# Patient Record
Sex: Female | Born: 1955 | Race: Black or African American | Hispanic: No | Marital: Married | Smoking: Former smoker
Health system: Southern US, Community
[De-identification: ages and names within clinical notes are randomized; demographics above are authoritative.]

## PROBLEM LIST (undated history)

## (undated) ENCOUNTER — Ambulatory Visit: Payer: BC Managed Care – PPO | Source: Home / Self Care

## (undated) DIAGNOSIS — R112 Nausea with vomiting, unspecified: Secondary | ICD-10-CM

## (undated) DIAGNOSIS — Z9109 Other allergy status, other than to drugs and biological substances: Secondary | ICD-10-CM

## (undated) DIAGNOSIS — N951 Menopausal and female climacteric states: Secondary | ICD-10-CM

## (undated) DIAGNOSIS — Z9889 Other specified postprocedural states: Secondary | ICD-10-CM

## (undated) HISTORY — DX: Nausea with vomiting, unspecified: R11.2

## (undated) HISTORY — DX: Other allergy status, other than to drugs and biological substances: Z91.09

## (undated) HISTORY — DX: Other specified postprocedural states: Z98.890

## (undated) HISTORY — PX: DILATION AND CURETTAGE OF UTERUS: SHX78

## (undated) HISTORY — PX: TONSILLECTOMY: SUR1361

## (undated) HISTORY — DX: Menopausal and female climacteric states: N95.1

## (undated) HISTORY — PX: APPENDECTOMY: SHX54

---

## 1998-01-21 ENCOUNTER — Other Ambulatory Visit: Admission: RE | Admit: 1998-01-21 | Discharge: 1998-01-21 | Payer: Self-pay | Admitting: *Deleted

## 2000-04-12 ENCOUNTER — Other Ambulatory Visit: Admission: RE | Admit: 2000-04-12 | Discharge: 2000-04-12 | Payer: Self-pay | Admitting: Obstetrics and Gynecology

## 2002-08-31 ENCOUNTER — Other Ambulatory Visit: Admission: RE | Admit: 2002-08-31 | Discharge: 2002-08-31 | Payer: Self-pay | Admitting: Obstetrics and Gynecology

## 2003-09-05 ENCOUNTER — Other Ambulatory Visit: Admission: RE | Admit: 2003-09-05 | Discharge: 2003-09-05 | Payer: Self-pay | Admitting: Obstetrics and Gynecology

## 2005-07-07 ENCOUNTER — Other Ambulatory Visit: Admission: RE | Admit: 2005-07-07 | Discharge: 2005-07-07 | Payer: Self-pay | Admitting: Obstetrics and Gynecology

## 2006-11-09 ENCOUNTER — Encounter: Admission: RE | Admit: 2006-11-09 | Discharge: 2006-11-09 | Payer: Self-pay | Admitting: Obstetrics and Gynecology

## 2007-11-01 ENCOUNTER — Emergency Department (HOSPITAL_COMMUNITY): Admission: EM | Admit: 2007-11-01 | Discharge: 2007-11-01 | Payer: Self-pay | Admitting: Emergency Medicine

## 2008-04-04 IMAGING — MG MM DIAGNOSTIC UNILATERAL R
4 series · 4 of 4 positions shown · non-contrast
Comparison: 07/07/05, 08/31/02 and 09/05/03.

DG DIAGNOSTIC UNILATERAL R
CC and MLO view(s) were taken of the right breast.

DIGITAL UNILATERAL RIGHT DIAGNOSTIC MAMMOGRAM:
CLINICAL DATA: 50-year-old returns for evaluation of the right breast following screening study at

[R CC (1 of 2)]
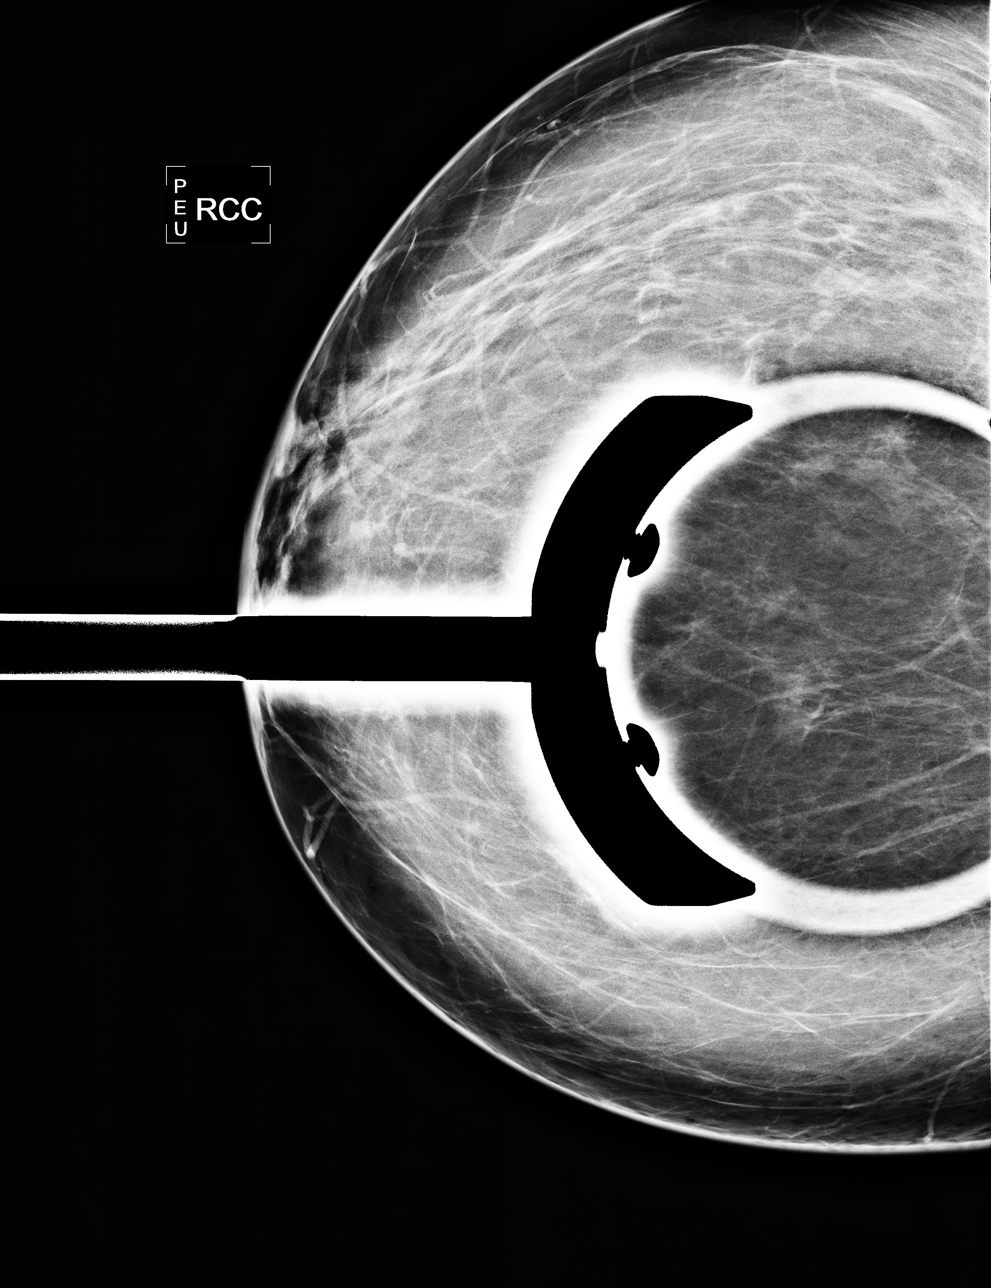

[R MLO]
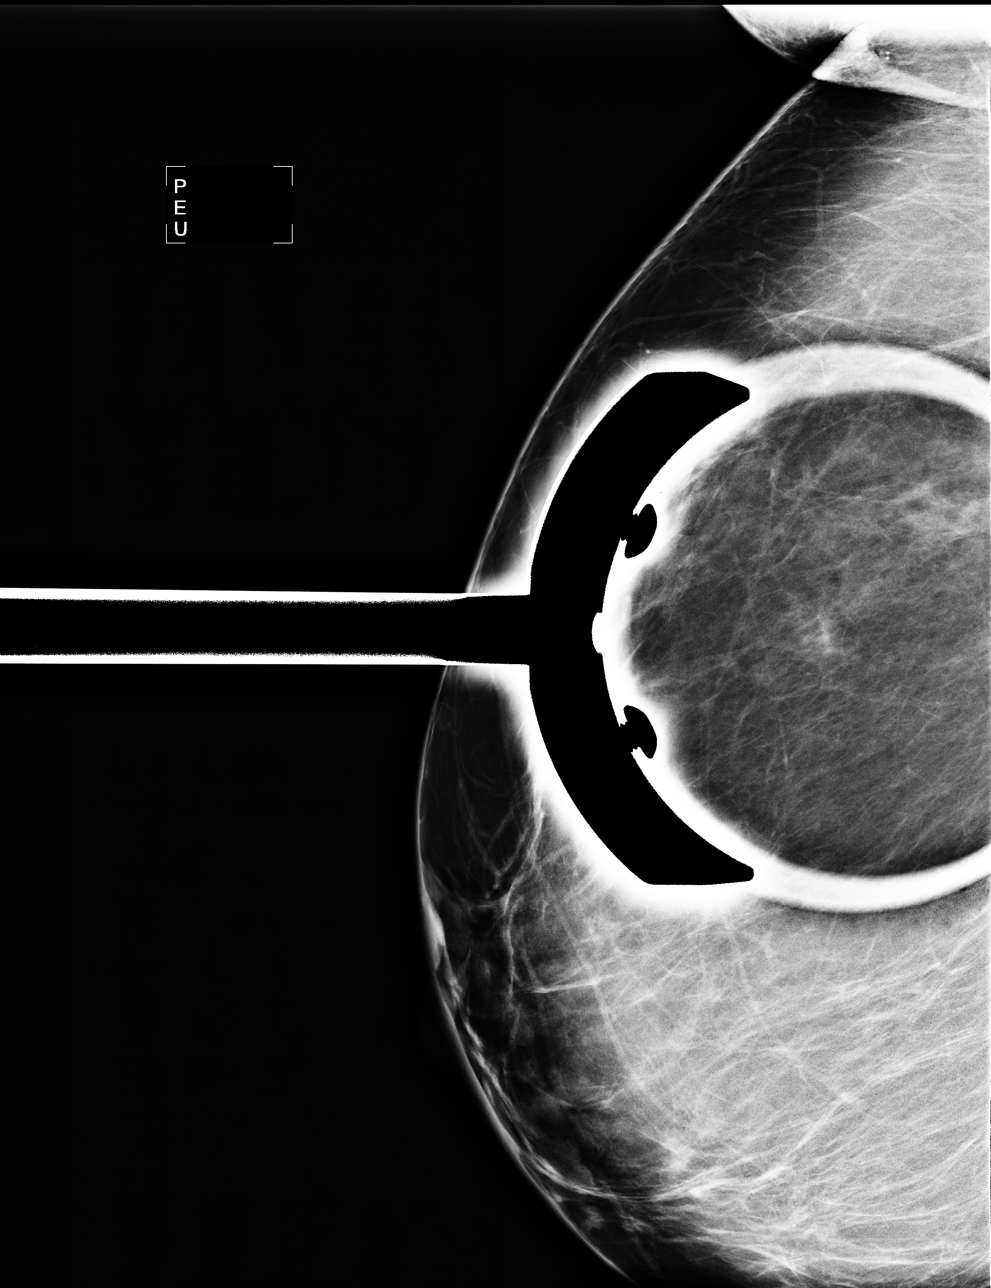

[R ML]
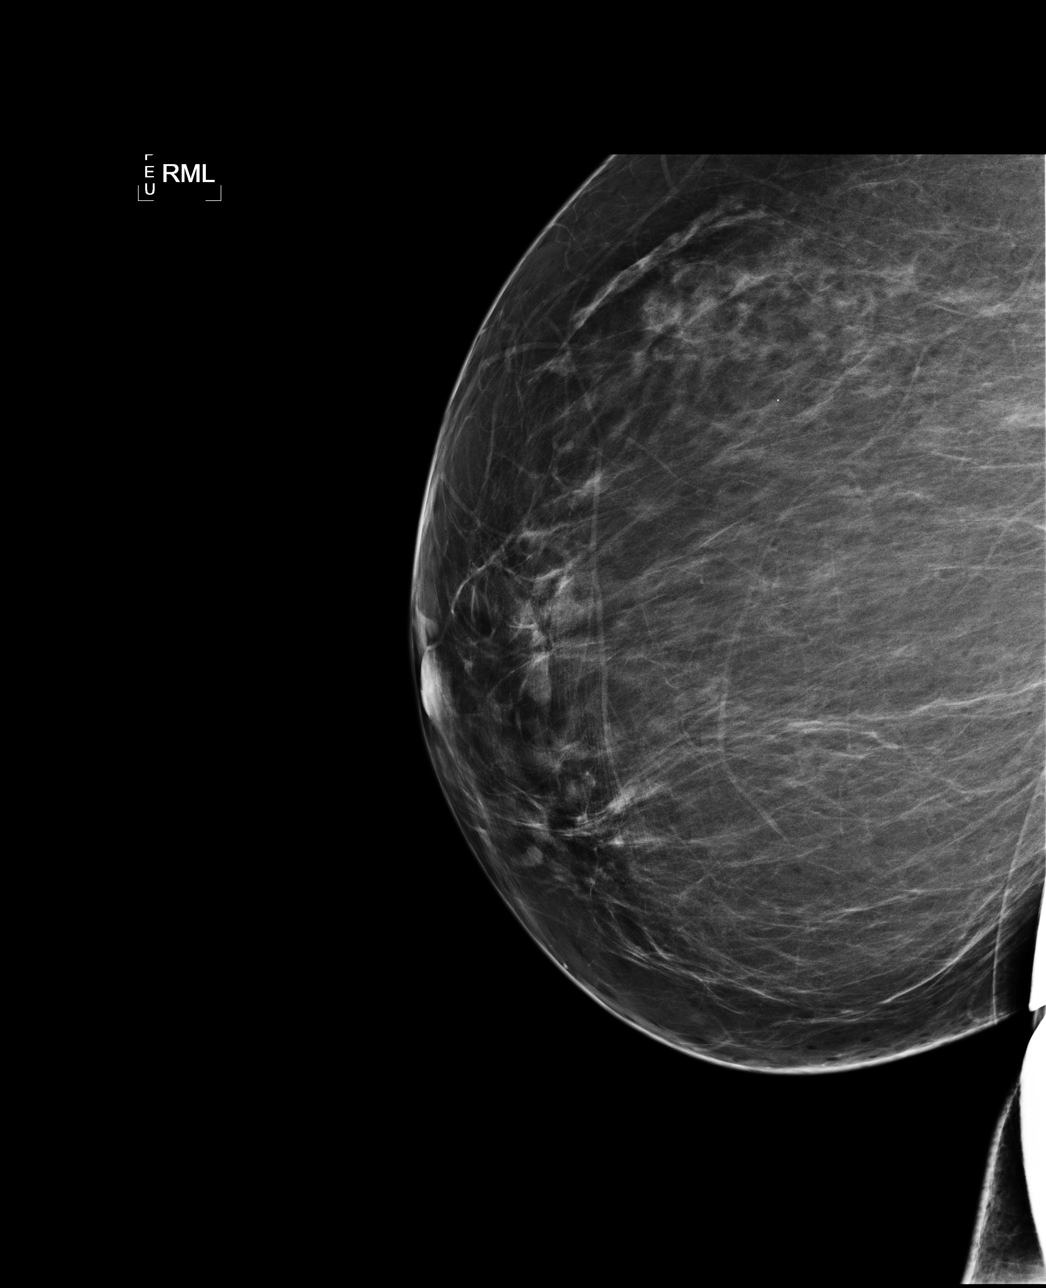

[R CC (2 of 2)]
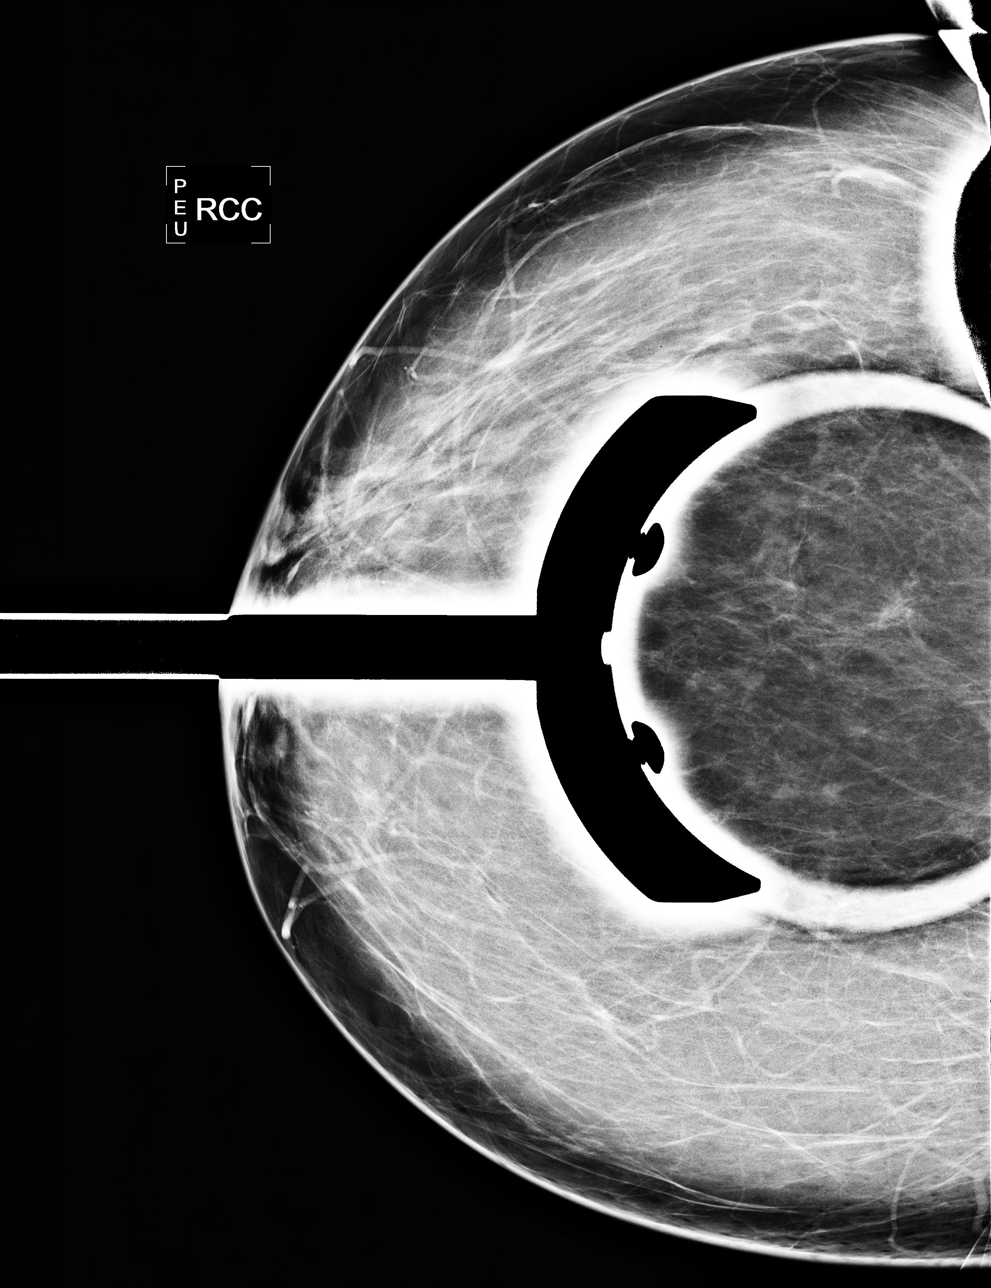

[4 of 4 positions shown; findings below may reference images not displayed]

Spot compression views are performed of the right breast, showing patchy fibroglandular parenchyma 
in the upper portion of the right breast. No discrete mass or distortion is identified.
IMPRESSION: No persistent area of concern on further evaluation of the right breast.

ASSESSMENT: Negative - BI-RADS 1

Screening mammogram of both breasts in 1 year.
, THIS PROCEDURE WAS A DIGITAL MAMMOGRAM.

## 2009-03-20 ENCOUNTER — Encounter: Payer: Self-pay | Admitting: Cardiology

## 2009-03-27 IMAGING — CR DG CHEST 2V
2 series · 2 of 2 positions shown · non-contrast
Comparison: None

CLINICAL DATA: Dyspnea. Chest pain.

CHEST - 2 VIEW

[w chest pa]
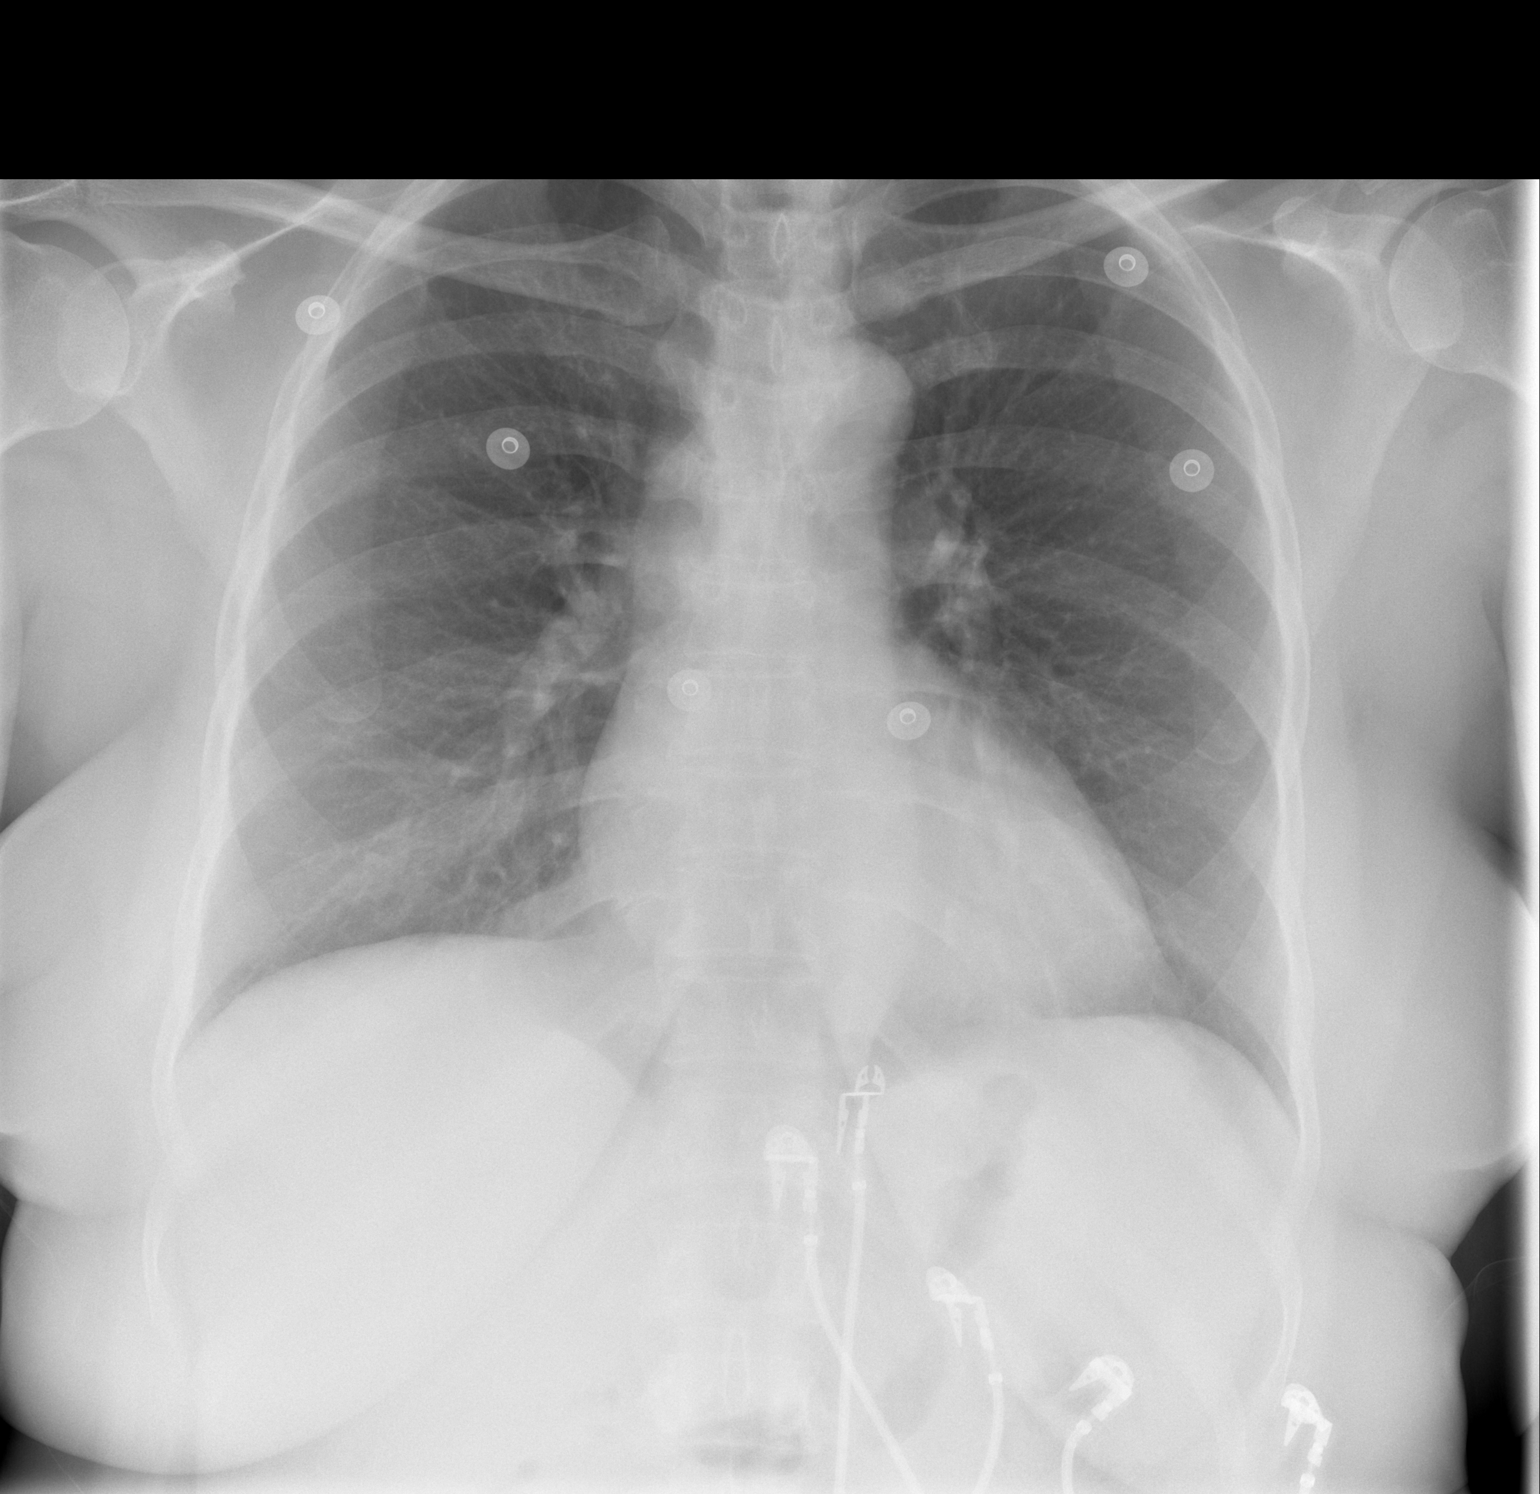

[w chest lat]
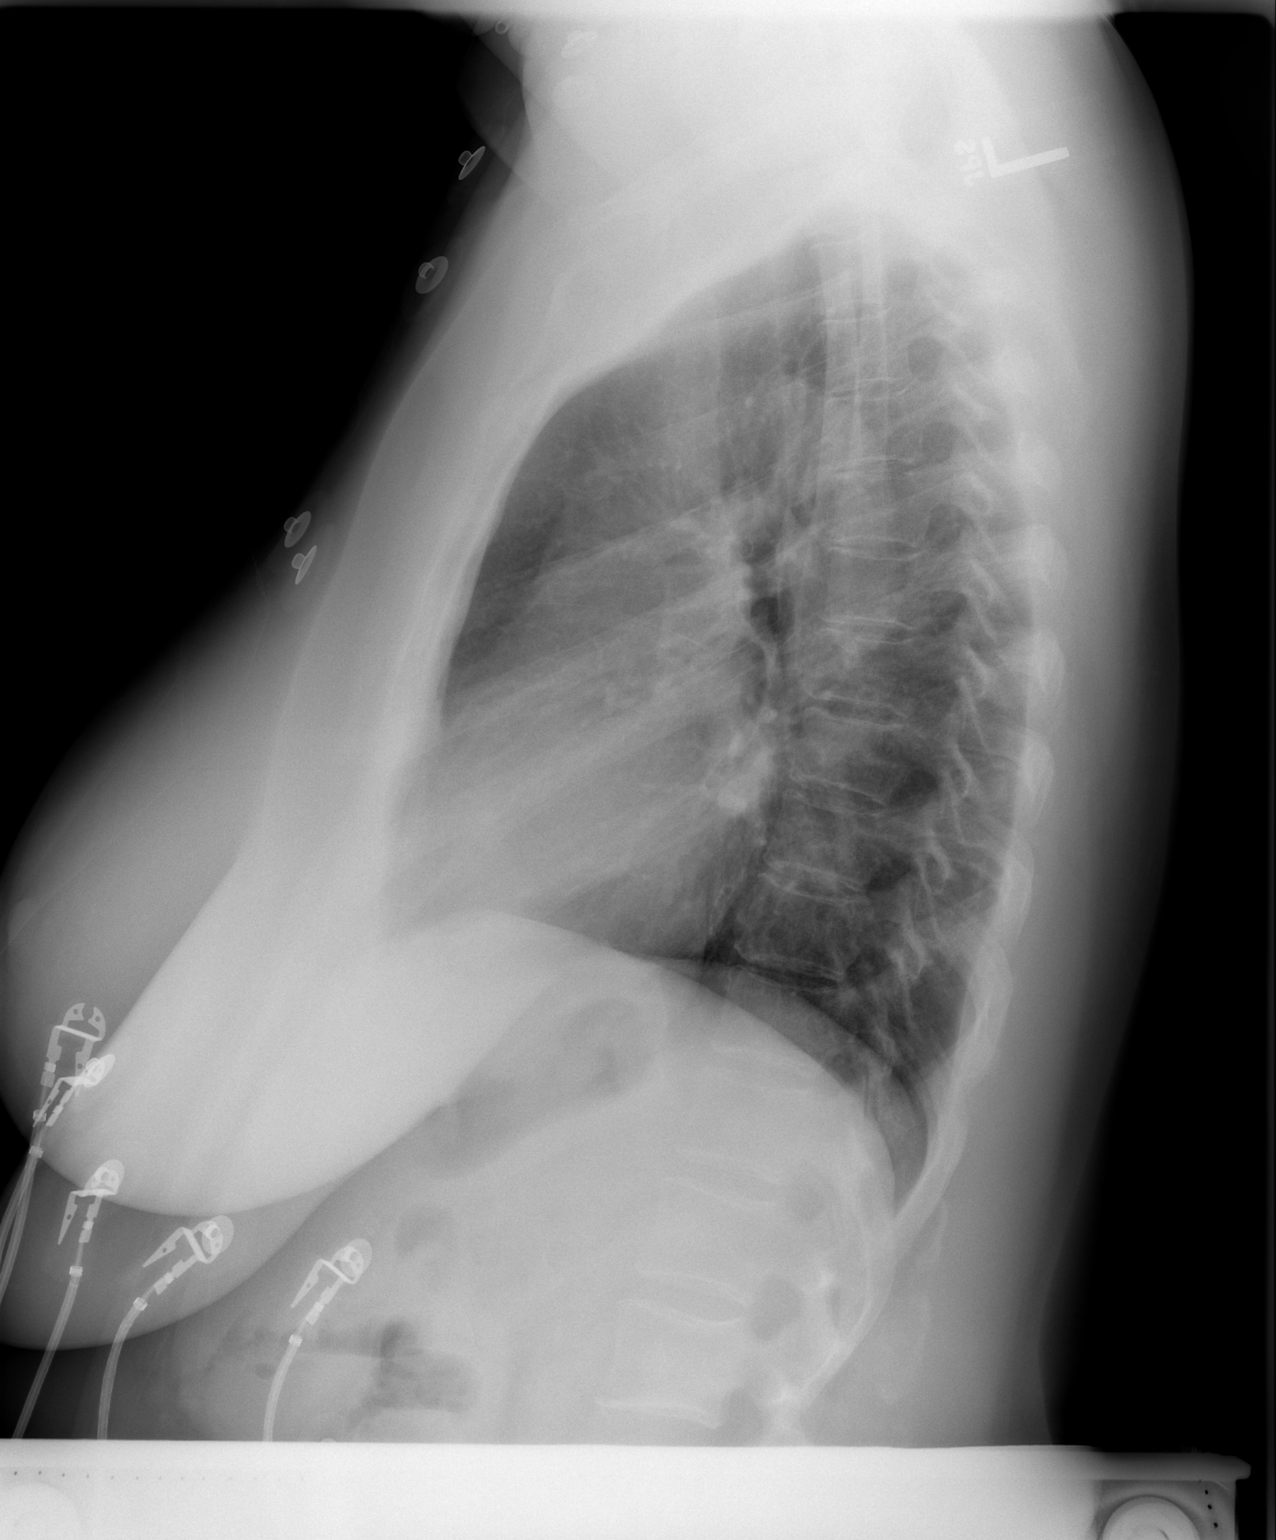

[2 of 2 positions shown; findings below may reference images not displayed]

FINDINGS: Cardiac and mediastinal contours appear normal. No adenopathy
identified. The lungs appear clear. No pleural effusion or acute thoracic
findings.

IMPRESSION

1. No acute thoracic findings.

## 2009-04-30 DIAGNOSIS — R079 Chest pain, unspecified: Secondary | ICD-10-CM

## 2009-05-08 ENCOUNTER — Ambulatory Visit: Payer: Self-pay | Admitting: Cardiology

## 2009-05-08 DIAGNOSIS — E783 Hyperchylomicronemia: Secondary | ICD-10-CM | POA: Insufficient documentation

## 2010-01-24 ENCOUNTER — Ambulatory Visit (HOSPITAL_COMMUNITY): Admission: RE | Admit: 2010-01-24 | Discharge: 2010-01-24 | Payer: Self-pay | Admitting: Obstetrics and Gynecology

## 2010-11-09 ENCOUNTER — Encounter: Payer: Self-pay | Admitting: Obstetrics and Gynecology

## 2011-01-07 LAB — CBC
HCT: 40.5 % (ref 36.0–46.0)
MCV: 87.7 fL (ref 78.0–100.0)
RBC: 4.62 MIL/uL (ref 3.87–5.11)
WBC: 7.4 10*3/uL (ref 4.0–10.5)

## 2011-07-09 LAB — CBC
Hemoglobin: 12.7
MCHC: 33.4
Platelets: 373
RDW: 14.8

## 2011-07-09 LAB — DIFFERENTIAL
Basophils Absolute: 0
Basophils Relative: 0
Eosinophils Absolute: 0.1
Eosinophils Relative: 1
Monocytes Absolute: 0.5
Monocytes Relative: 5
Neutro Abs: 5.8

## 2011-07-09 LAB — POCT CARDIAC MARKERS
CKMB, poc: 1.1
CKMB, poc: 1.3
Myoglobin, poc: 78.8
Operator id: 284251
Operator id: 284251
Troponin i, poc: <0.05
Troponin i, poc: <0.05

## 2011-07-09 LAB — URINALYSIS, ROUTINE W REFLEX MICROSCOPIC: Specific Gravity, Urine: 1.026

## 2011-07-09 LAB — BASIC METABOLIC PANEL: Potassium: 3.9

## 2012-11-18 ENCOUNTER — Other Ambulatory Visit: Payer: Self-pay | Admitting: Obstetrics and Gynecology

## 2013-11-16 ENCOUNTER — Encounter: Payer: Self-pay | Admitting: Gastroenterology

## 2013-11-16 ENCOUNTER — Other Ambulatory Visit: Payer: Self-pay | Admitting: Obstetrics and Gynecology

## 2013-12-12 ENCOUNTER — Ambulatory Visit (AMBULATORY_SURGERY_CENTER): Payer: Self-pay

## 2013-12-12 VITALS — Ht 63.0 in | Wt 190.0 lb

## 2013-12-12 DIAGNOSIS — Z1211 Encounter for screening for malignant neoplasm of colon: Secondary | ICD-10-CM

## 2013-12-12 MED ORDER — MOVIPREP 100 G PO SOLR: 1.0000 | Freq: Once | ORAL | Status: AC

## 2013-12-14 ENCOUNTER — Encounter: Payer: Self-pay | Admitting: Gastroenterology

## 2013-12-26 ENCOUNTER — Encounter: Payer: Self-pay | Admitting: Gastroenterology

## 2015-01-25 ENCOUNTER — Other Ambulatory Visit: Payer: Self-pay | Admitting: Obstetrics and Gynecology

## 2015-01-28 LAB — CYTOLOGY - PAP

## 2020-05-28 ENCOUNTER — Ambulatory Visit: Payer: Self-pay

## 2020-05-28 ENCOUNTER — Ambulatory Visit: Payer: Self-pay | Attending: Internal Medicine

## 2020-05-28 DIAGNOSIS — Z23 Encounter for immunization: Secondary | ICD-10-CM

## 2020-05-28 NOTE — Progress Notes (Signed)
   Covid-19 Vaccination Clinic  Name:  MALICIA BLASDEL    MRN: [id number] DOB: 1955-12-18  05/28/2020  Ms. Kuhnle was observed post Covid-19 immunization for 30 minutes based on pre-vaccination screening without incident. She was provided with Vaccine Information Sheet and instruction to access the V-Safe system.   Ms. Piscitelli was instructed to call 911 with any severe reactions post vaccine: Marland Kitchen Difficulty breathing  . Swelling of face and throat  . A fast heartbeat  . A bad rash all over body  . Dizziness and weakness   Immunizations Administered    Name Date Dose VIS Date Route   Pfizer COVID-19 Vaccine 05/28/2020  3:20 PM 0.3 mL 12/13/2018 Intramuscular   Manufacturer: [employer], [employer]   Lot: [id number]   NDC: [address]-5

## 2020-06-18 ENCOUNTER — Ambulatory Visit: Payer: BC Managed Care – PPO | Attending: Critical Care Medicine

## 2020-06-18 DIAGNOSIS — Z23 Encounter for immunization: Secondary | ICD-10-CM

## 2020-06-18 NOTE — Progress Notes (Signed)
   Covid-19 Vaccination Clinic  Name:  LIBNI FUSARO    MRN: [id number] DOB: 03/01/1956  06/18/2020  Ms. Barbary was observed post Covid-19 immunization for 30 minutes based on pre-vaccination screening without incident. She was provided with Vaccine Information Sheet and instruction to access the V-Safe system.   Ms. Boak was instructed to call 911 with any severe reactions post vaccine: Marland Kitchen Difficulty breathing  . Swelling of face and throat  . A fast heartbeat  . A bad rash all over body  . Dizziness and weakness   Immunizations Administered    Name Date Dose VIS Date Route   Pfizer COVID-19 Vaccine 06/18/2020  3:07 PM 0.3 mL 12/13/2018 Intramuscular   Manufacturer: [employer], [employer]   Lot: [id number]   NDC: [address]-6

## 2022-04-08 ENCOUNTER — Other Ambulatory Visit: Payer: Self-pay | Admitting: Obstetrics and Gynecology

## 2022-04-08 DIAGNOSIS — R928 Other abnormal and inconclusive findings on diagnostic imaging of breast: Secondary | ICD-10-CM

## 2022-04-17 ENCOUNTER — Ambulatory Visit
Admission: RE | Admit: 2022-04-17 | Discharge: 2022-04-17 | Disposition: A | Discharge status: Home / Self Care | Payer: BC Managed Care – PPO | Source: Ambulatory Visit | Attending: Obstetrics and Gynecology | Admitting: Obstetrics and Gynecology

## 2022-04-17 DIAGNOSIS — R928 Other abnormal and inconclusive findings on diagnostic imaging of breast: Secondary | ICD-10-CM

## 2022-09-28 ENCOUNTER — Ambulatory Visit
Admission: EM | Admit: 2022-09-28 | Discharge: 2022-09-28 | Disposition: A | Discharge status: Home / Self Care | Payer: BC Managed Care – PPO

## 2022-09-28 DIAGNOSIS — J309 Allergic rhinitis, unspecified: Secondary | ICD-10-CM | POA: Diagnosis not present

## 2022-09-28 DIAGNOSIS — J208 Acute bronchitis due to other specified organisms: Secondary | ICD-10-CM

## 2022-09-28 MED ORDER — ALBUTEROL SULFATE HFA 108 (90 BASE) MCG/ACT IN AERS: 2.0000 | INHALATION_SPRAY | Freq: Four times a day (QID) | RESPIRATORY_TRACT | 0 refills | Status: AC | PRN

## 2022-09-28 MED ORDER — METHYLPREDNISOLONE 8 MG PO TABS: 8.0000 mg | ORAL_TABLET | Freq: Every day | ORAL | 0 refills | Status: DC

## 2022-09-28 MED ORDER — FLUTICASONE PROPIONATE 50 MCG/ACT NA SUSP: 1.0000 | Freq: Every day | NASAL | 2 refills | Status: AC

## 2022-09-28 MED ORDER — METHYLPREDNISOLONE 8 MG PO TABS: 16.0000 mg | ORAL_TABLET | Freq: Every day | ORAL | 0 refills | Status: AC

## 2022-09-28 MED ORDER — IBUPROFEN 400 MG PO TABS: 400.0000 mg | ORAL_TABLET | Freq: Three times a day (TID) | ORAL | 0 refills | Status: AC | PRN

## 2022-09-28 MED ORDER — LORATADINE 10 MG PO TABS: 10.0000 mg | ORAL_TABLET | Freq: Every day | ORAL | 1 refills | Status: AC

## 2022-09-28 MED ORDER — IPRATROPIUM-ALBUTEROL 0.5-2.5 (3) MG/3ML IN SOLN
3.0000 mL | Freq: Once | RESPIRATORY_TRACT | Status: AC
  Administered 2022-09-28: 3 mL via RESPIRATORY_TRACT

## 2022-09-28 NOTE — Discharge Instructions (Signed)
At this time, I believe that you are suffering from a viral bronchitis as well as uncontrolled respiratory allergies.  I recommend that you discontinue Claritin-D as it contains an extra ingredient, Sudafed, it may actually be making your symptoms worse at this time.  Please read below to learn more about the medications, dosages and frequencies that I recommend to help alleviate your symptoms and to get you feeling better soon:  Medrol (methylprednisolone): This is a steroid that will significantly calm your upper and lower airways, please take the daily recommended quantity of tablets daily with your breakfast meal starting tomorrow morning until the prescription is complete.  Please do not take ibuprofen or naproxen (Advil, Motrin, Aleve) while taking Medrol.   Claritin (loratadine): This is an excellent second-generation antihistamine that does not contain pseudoephedrine.  Claritin helps to reduce respiratory inflammatory response to environmental allergens.  This medication is not known to cause daytime sleepiness so it can be taken in the daytime.  If you find that it does make you sleepy, please feel free to take it at bedtime.   Flonase (fluticasone): This is a steroid nasal spray that you use once daily, 1 spray in each nare.  This medication does not work well if you decide to use it only used as you feel you need to, it works best used on a daily basis.  After 3 to 5 days of use, you will notice significant reduction of the inflammation and mucus production that is currently being caused by exposure to allergens, whether seasonal or environmental.  The most common side effect of this medication is nosebleeds.  If you experience a nosebleed, please discontinue use for 1 week, then feel free to resume.  I have provided you with a prescription.     ProAir, Ventolin, Proventil (albuterol): This inhaled medication contains a short acting beta agonist bronchodilator.  This medication works on the  smooth muscle that opens and constricts of your airways by relaxing the muscle.  The result of relaxation of the smooth muscle is increased air movement and improved work of breathing.  This is a short acting medication that can be used every 4-6 hours as needed for increased work of breathing, shortness of breath, wheezing and excessive coughing.  I have provided you with a prescription.  Please inhale 2 puffs 4 times daily for the next few days, then decrease to 2 puffs twice daily and as needed until you are feeling completely better.   Advil, Motrin (ibuprofen): This is a good anti-inflammatory medication which not only addresses aches, pains but also significantly reduces soft tissue inflammation of the upper airways that causes sinus and nasal congestion as well as inflammation of the lower airways which makes you feel like your breathing is constricted or your cough feel tight.  I recommend that you take 400 mg every 8 hours as needed after have completed your prescription for Medrol.      If you find that you have not had improvement of your symptoms in the next 3 to 5 days, please follow-up with your primary care provider or return here to urgent care for repeat evaluation and further recommendations.   Thank you for visiting urgent care today.  We appreciate the opportunity to participate in your care.

## 2022-09-28 NOTE — ED Provider Notes (Signed)
[facility]    CSN: [id number] Arrival date & time: 09/28/22  7915    HISTORY   Chief Complaint  Patient presents with   Shortness of Breath   Generalized Body Aches   Cough   Sore Throat   Headache   HPI Mindy Hansen is a pleasant, 66 y.o. female who presents to urgent care today. Patient complains of shortness of breath, body aches, nonproductive cough, sore throat and headache for the past 5 days.  Patient states she has been taking Advil, Tylenol, Alka-Seltzer cold and Benadryl.  Patient has essentially normal vital signs on arrival today with the exception of an elevated heart rate.  Patient reports a history of allergies, states she intermittently takes Claritin-D but has stopped Claritin-D so that she can take Benadryl, states Benadryl is helping some but does not feel that the Alka-Seltzer does much.  Patient states she quit smoking cigarettes 40 years ago.  Patient reports a history of multiple episodes of bronchitis but has not had an episode in several years mostly because she tries to avoid being around other people.  Patient states she does work outside of her home sick contacts, states her husband began develop symptoms yesterday.  The history is provided by the patient.   Past Medical History:  Diagnosis Date   Environmental allergies    Perimenopausal    PONV (postoperative nausea and vomiting)    Patient Active Problem List   Diagnosis Date Noted   HYPERLIPIDEMIA TYPE I / IV 05/08/2009   CHEST PAIN-UNSPECIFIED 04/30/2009   Past Surgical History:  Procedure Laterality Date   APPENDECTOMY     DILATION AND CURETTAGE OF UTERUS     TONSILLECTOMY     OB History   No obstetric history on file.    Home Medications    Prior to Admission medications   Medication Sig Start Date End Date Taking? Authorizing Provider  olmesartan (BENICAR) 20 MG tablet 1 tablet Orally Once a day 12/08/18  Yes [provider]  aspirin 81 MG tablet Take 81  mg by mouth daily.    [provider]  diphenhydrAMINE (BENADRYL) 50 MG tablet Take 50 mg by mouth at bedtime as needed for itching.    [provider]  EUTHYROX 25 MCG tablet TAKE 1 TABLET BY MOUTH IN THE MORNING ON AN EMPTY STOMACH Orally Once a day for 90 days    [provider]  ibuprofen (ADVIL,MOTRIN) 200 MG tablet Take 200 mg by mouth every 6 (six) hours as needed. 2 which equals 440m when needed    [provider]  JANUVIA 50 MG tablet Take 50 mg by mouth daily.    [provider]  loratadine-pseudoephedrine (CLARITIN-D 24-HOUR) 10-240 MG per 24 hr tablet Take 1 tablet by mouth daily.    [provider]  MOVIPREP 100 G SOLR Take 1 kit (200 g total) by mouth once. 12/12/13   JMilus Banister MD  Multiple Vitamin (MULTIVITAMIN) tablet Take 1 tablet by mouth daily. Energy vitamin    [provider]  Nutritional Supplements (ESTROVEN PM PO) Take 1 capsule by mouth.    [provider]  Omega-3 Fatty Acids (OMEGA 3 PO) Take by mouth. 12014mfish oil (2 daily) and 360 mg omega 3    [provider]  OVER THE COUNTER MEDICATION blackcohosh 6 pills daily 4054mach    [provider]  OVER THE COUNTER MEDICATION Calcium magnesium potassium    [provider]  rosuvastatin (CRESTOR) 5 MG tablet Take 5 mg by mouth daily.    [provider]    Family History Family History  Problem Relation Age of Onset   Stomach cancer Maternal Grandfather    Colon cancer Neg Hx    Pancreatic cancer Neg Hx    Social History Social History   Tobacco Use   Smoking status: Former   Smokeless tobacco: Never  Substance Use Topics   Alcohol use: Yes    Comment: once every 2-3 months; glass of wine   Drug use: No   Allergies   Dapagliflozin, Levothyroxine sodium, Shellfish allergy, Sulfonamide derivatives, Chocolate, and Orange fruit [citrus]  Review of Systems Review of Systems Pertinent findings  revealed after performing a 14 point review of systems has been noted in the history of present illness.  Physical Exam Triage Vital Signs ED Triage Vitals  Enc Vitals Group     BP 08/15/21 0827 (!) 147/82     Pulse Rate 08/15/21 0827 72     Resp 08/15/21 0827 18     Temp 08/15/21 0827 98.3 F (36.8 C)     Temp Source 08/15/21 0827 Oral     SpO2 08/15/21 0827 98 %     Weight --      Height --      Head Circumference --      Peak Flow --      Pain Score 08/15/21 0826 5     Pain Loc --      Pain Edu? --      Excl. in [place]? --   No data found.  Updated Vital Signs BP 137/87 (BP Location: Left Arm)   Pulse (!) 109   Temp 98.3 F (36.8 C) (Oral)   Resp 17   SpO2 100%   Physical Exam Vitals and nursing note reviewed.  Constitutional:      General: She is not in acute distress.    Appearance: Normal appearance. She is not ill-appearing.  HENT:     Head: Normocephalic and atraumatic.     Salivary Glands: Right salivary gland is not diffusely enlarged or tender. Left salivary gland is not diffusely enlarged or tender.     Right Ear: Ear canal and external ear normal. No drainage. No middle ear effusion. There is no impacted cerumen. Tympanic membrane is bulging. Tympanic membrane is not injected or erythematous.     Left Ear: Ear canal and external ear normal. No drainage.  No middle ear effusion. There is no impacted cerumen. Tympanic membrane is bulging. Tympanic membrane is not injected or erythematous.     Ears:     Comments: Bilateral EACs normal, both TMs bulging with clear fluid    Nose: Rhinorrhea present. No nasal deformity, septal deviation, signs of injury, nasal tenderness, mucosal edema or congestion. Rhinorrhea is clear.     Right Nostril: Occlusion present. No foreign body, epistaxis or septal hematoma.     Left Nostril: Occlusion present. No foreign body, epistaxis or septal hematoma.     Right Turbinates: Enlarged, swollen and pale.     Left Turbinates: Enlarged,  swollen and pale.     Right Sinus: No maxillary sinus tenderness or frontal sinus tenderness.     Left Sinus: No maxillary sinus tenderness or frontal sinus tenderness.     Mouth/Throat:     Lips: Pink. No lesions.     Mouth: Mucous membranes are moist. No oral lesions.     Pharynx: Oropharynx is clear. Uvula midline.  No pharyngeal swelling, oropharyngeal exudate, posterior oropharyngeal erythema or uvula swelling.     Tonsils: No tonsillar exudate or tonsillar abscesses. 0 on the right. 0 on the left.     Comments: Postnasal drip, cobblestoning on posterior pharynx appreciated Eyes:     General: Lids are normal.        Right eye: No discharge.        Left eye: No discharge.     Extraocular Movements: Extraocular movements intact.     Conjunctiva/sclera: Conjunctivae normal.     Right eye: Right conjunctiva is not injected.     Left eye: Left conjunctiva is not injected.     Pupils: Pupils are equal, round, and reactive to light.  Neck:     Trachea: Trachea and phonation normal.  Cardiovascular:     Rate and Rhythm: Regular rhythm. Tachycardia present.     Pulses: Normal pulses.     Heart sounds: Normal heart sounds, S1 normal and S2 normal. No murmur heard.    No friction rub. No gallop.  Pulmonary:     Effort: Pulmonary effort is normal. No tachypnea, bradypnea, accessory muscle usage, prolonged expiration, respiratory distress or retractions.     Breath sounds: No stridor, decreased air movement or transmitted upper airway sounds. Examination of the right-upper field reveals decreased breath sounds. Examination of the left-upper field reveals decreased breath sounds. Examination of the right-middle field reveals decreased breath sounds. Examination of the left-middle field reveals decreased breath sounds. Examination of the right-lower field reveals decreased breath sounds. Examination of the left-lower field reveals decreased breath sounds. Decreased breath sounds present. No wheezing,  rhonchi or rales.     Comments: Repeat auscultation post nebulized bronchodilator revealed improved breath sounds without wheeze or rales, mild rhonchi appreciated in lower lung fields.  Patient reported improved work of breathing. Chest:     Chest wall: No tenderness.  Musculoskeletal:        General: Normal range of motion.     Cervical back: Full passive range of motion without pain, normal range of motion and neck supple. Normal range of motion.  Lymphadenopathy:     Cervical: Cervical adenopathy present.     Right cervical: Superficial cervical adenopathy present.     Left cervical: Superficial cervical adenopathy present.  Skin:    General: Skin is warm and dry.     Findings: No erythema or rash.  Neurological:     General: No focal deficit present.     Mental Status: She is alert and oriented to person, place, and time.  Psychiatric:        Mood and Affect: Mood normal.        Behavior: Behavior normal.     Visual Acuity Right Eye Distance:   Left Eye Distance:   Bilateral Distance:    Right Eye Near:   Left Eye Near:    Bilateral Near:     UC Couse / Diagnostics / Procedures:     Radiology No results found.  Procedures Procedures (including critical care time) EKG  Pending results:  Labs Reviewed - No data to display  Medications Ordered in UC: Medications  ipratropium-albuterol (DUONEB) 0.5-2.5 (3) MG/3ML nebulizer solution 3 mL (3 mLs Nebulization Given 09/28/22 1016)    UC Diagnoses / Final Clinical Impressions(s)   I have reviewed the triage vital signs and the nursing notes.  Pertinent labs & imaging results that were available during my care of the patient were reviewed by me and considered in  my medical decision making (Mindy chart for details).    Final diagnoses:  Acute viral bronchitis  Allergic rhinitis, unspecified seasonality, unspecified trigger   Patient provided with allergy medications given physical exam findings concerning for upper  respiratory allergies that are not well-controlled.  Patient advised to stop Claritin-D as pseudoephedrine is likely exacerbating her symptoms rather than improving.  Patient provided with an albuterol inhaler and a 3-day course of methylprednisolone.  Viral testing not indicated due to duration of symptoms.  Patient afebrile, do not believe x-ray or antibiotic treatment indicated at this time.  Please Mindy discharge instructions below for further details of plan of care as provided to patient. ED Prescriptions     Medication Sig Dispense Auth. Provider   albuterol (VENTOLIN HFA) 108 (90 Base) MCG/ACT inhaler Inhale 2 puffs into the lungs every 6 (six) hours as needed for wheezing or shortness of breath (Cough). 18 g Lynden Oxford Scales, PA-C   methylPREDNISolone (MEDROL) 8 MG tablet  (Status: Discontinued) Take 1 tablet (8 mg total) by mouth daily for 3 days. 3 tablet Lynden Oxford Scales, PA-C   ibuprofen (ADVIL) 400 MG tablet Take 1 tablet (400 mg total) by mouth every 8 (eight) hours as needed for up to 30 doses. 30 tablet Lynden Oxford Scales, PA-C   loratadine (CLARITIN) 10 MG tablet Take 1 tablet (10 mg total) by mouth daily. 90 tablet Lynden Oxford Scales, PA-C   fluticasone (FLONASE) 50 MCG/ACT nasal spray Place 1 spray into both nostrils daily. Begin by using 2 sprays in each nare daily for 3 to 5 days, then decrease to 1 spray in each nare daily. 15.8 mL Lynden Oxford Scales, PA-C   methylPREDNISolone (MEDROL) 8 MG tablet Take 2 tablets (16 mg total) by mouth daily for 3 days. 6 tablet Lynden Oxford Scales, PA-C      PDMP not reviewed this encounter.  Disposition Upon Discharge:  Condition: stable for discharge home Home: take medications as prescribed; routine discharge instructions as discussed; follow up as advised.  Patient presented with an acute illness with associated systemic symptoms and significant discomfort requiring urgent management. In my opinion, this is a  condition that a prudent lay person (someone who possesses an average knowledge of health and medicine) may potentially expect to result in complications if not addressed urgently such as respiratory distress, impairment of bodily function or dysfunction of bodily organs.   Routine symptom specific, illness specific and/or disease specific instructions were discussed with the patient and/or caregiver at length.   As such, the patient has been evaluated and assessed, work-up was performed and treatment was provided in alignment with urgent care protocols and evidence based medicine.  Patient/parent/caregiver has been advised that the patient may require follow up for further testing and treatment if the symptoms continue in spite of treatment, as clinically indicated and appropriate.  If the patient was tested for COVID-19, Influenza and/or RSV, then the patient/parent/guardian was advised to isolate at home pending the results of his/her diagnostic coronavirus test and potentially longer if they're positive. I have also advised pt that if his/her COVID-19 test returns positive, it's recommended to self-isolate for at least 10 days after symptoms first appeared AND until fever-free for 24 hours without fever reducer AND other symptoms have improved or resolved. Discussed self-isolation recommendations as well as instructions for household member/close contacts as per the [facility] and [place] [facility], and also gave patient the [facility] packet with this information.  Patient/parent/caregiver has been advised to return to the  [facility] or PCP in 3-5 days if no better; to PCP or the Emergency Department if new signs and symptoms develop, or if the current signs or symptoms continue to change or worsen for further workup, evaluation and treatment as clinically indicated and appropriate  The patient will follow up with their current PCP if and as advised. If the patient does not currently have a PCP we will assist them in obtaining  one.   The patient may need specialty follow up if the symptoms continue, in spite of conservative treatment and management, for further workup, evaluation, consultation and treatment as clinically indicated and appropriate.  Patient/parent/caregiver verbalized understanding and agreement of plan as discussed.  All questions were addressed during visit.  Please Mindy discharge instructions below for further details of plan.  Discharge Instructions:   Discharge Instructions      At this time, I believe that you are suffering from a viral bronchitis as well as uncontrolled respiratory allergies.  I recommend that you discontinue Claritin-D as it contains an extra ingredient, Sudafed, it may actually be making your symptoms worse at this time.  Please read below to learn more about the medications, dosages and frequencies that I recommend to help alleviate your symptoms and to get you feeling better soon:  Medrol (methylprednisolone): This is a steroid that will significantly calm your upper and lower airways, please take the daily recommended quantity of tablets daily with your breakfast meal starting tomorrow morning until the prescription is complete.  Please do not take ibuprofen or naproxen (Advil, Motrin, Aleve) while taking Medrol.   Claritin (loratadine): This is an excellent second-generation antihistamine that does not contain pseudoephedrine.  Claritin helps to reduce respiratory inflammatory response to environmental allergens.  This medication is not known to cause daytime sleepiness so it can be taken in the daytime.  If you find that it does make you sleepy, please feel free to take it at bedtime.   Flonase (fluticasone): This is a steroid nasal spray that you use once daily, 1 spray in each nare.  This medication does not work well if you decide to use it only used as you feel you need to, it works best used on a daily basis.  After 3 to 5 days of use, you will notice significant  reduction of the inflammation and mucus production that is currently being caused by exposure to allergens, whether seasonal or environmental.  The most common side effect of this medication is nosebleeds.  If you experience a nosebleed, please discontinue use for 1 week, then feel free to resume.  I have provided you with a prescription.     ProAir, Ventolin, Proventil (albuterol): This inhaled medication contains a short acting beta agonist bronchodilator.  This medication works on the smooth muscle that opens and constricts of your airways by relaxing the muscle.  The result of relaxation of the smooth muscle is increased air movement and improved work of breathing.  This is a short acting medication that can be used every 4-6 hours as needed for increased work of breathing, shortness of breath, wheezing and excessive coughing.  I have provided you with a prescription.  Please inhale 2 puffs 4 times daily for the next few days, then decrease to 2 puffs twice daily and as needed until you are feeling completely better.   Advil, Motrin (ibuprofen): This is a good anti-inflammatory medication which not only addresses aches, pains but also significantly reduces soft tissue inflammation of the upper airways that causes  sinus and nasal congestion as well as inflammation of the lower airways which makes you feel like your breathing is constricted or your cough feel tight.  I recommend that you take 400 mg every 8 hours as needed after have completed your prescription for Medrol.      If you find that you have not had improvement of your symptoms in the next 3 to 5 days, please follow-up with your primary care provider or return here to urgent care for repeat evaluation and further recommendations.   Thank you for visiting urgent care today.  We appreciate the opportunity to participate in your care.       This office note has been dictated using Museum/gallery curator.  Unfortunately, this  method of dictation can sometimes lead to typographical or grammatical errors.  I apologize for your inconvenience in advance if this occurs.  Please do not hesitate to reach out to me if clarification is needed.      Lynden Oxford Scales, PA-C 09/28/22 1046

## 2022-09-28 NOTE — ED Triage Notes (Signed)
Pt c/o SOB, body aches, cough, sore throat and headache.   Started: last Thursday    Home interventions: Advil, tylenol, alka seltzer, benadryl

## 2023-02-03 ENCOUNTER — Other Ambulatory Visit: Payer: Self-pay | Admitting: Family Medicine

## 2023-02-03 ENCOUNTER — Ambulatory Visit: Payer: Self-pay

## 2023-02-03 DIAGNOSIS — M25511 Pain in right shoulder: Secondary | ICD-10-CM
